# Patient Record
Sex: Male | Born: 1985 | Race: Black or African American | Hispanic: No | Marital: Single | Smoking: Current every day smoker
Health system: Southern US, Community
[De-identification: ages and names within clinical notes are randomized; demographics above are authoritative.]

---

## 2007-01-23 ENCOUNTER — Emergency Department (HOSPITAL_COMMUNITY): Admission: EM | Admit: 2007-01-23 | Discharge: 2007-01-23 | Payer: Self-pay | Admitting: Emergency Medicine

## 2009-01-24 ENCOUNTER — Emergency Department (HOSPITAL_COMMUNITY): Admission: EM | Admit: 2009-01-24 | Discharge: 2009-01-24 | Payer: Self-pay | Admitting: Emergency Medicine

## 2011-06-16 ENCOUNTER — Emergency Department (HOSPITAL_COMMUNITY): Payer: Self-pay

## 2011-06-16 ENCOUNTER — Emergency Department (HOSPITAL_COMMUNITY)
Admission: EM | Admit: 2011-06-16 | Discharge: 2011-06-16 | Disposition: A | Discharge status: Home / Self Care | Payer: Self-pay | Attending: Emergency Medicine | Admitting: Emergency Medicine

## 2011-06-16 DIAGNOSIS — M25569 Pain in unspecified knee: Secondary | ICD-10-CM | POA: Insufficient documentation

## 2011-06-16 DIAGNOSIS — IMO0002 Reserved for concepts with insufficient information to code with codable children: Secondary | ICD-10-CM | POA: Insufficient documentation

## 2011-06-16 DIAGNOSIS — X500XXA Overexertion from strenuous movement or load, initial encounter: Secondary | ICD-10-CM | POA: Insufficient documentation
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
## 2011-06-22 ENCOUNTER — Ambulatory Visit (HOSPITAL_BASED_OUTPATIENT_CLINIC_OR_DEPARTMENT_OTHER)
Admission: RE | Admit: 2011-06-22 | Discharge: 2011-06-22 | Disposition: A | Discharge status: Home / Self Care | Payer: Self-pay | Source: Ambulatory Visit | Attending: Orthopedic Surgery | Admitting: Orthopedic Surgery

## 2011-06-22 DIAGNOSIS — F17200 Nicotine dependence, unspecified, uncomplicated: Secondary | ICD-10-CM | POA: Insufficient documentation

## 2011-06-22 DIAGNOSIS — M23205 Derangement of unspecified medial meniscus due to old tear or injury, unspecified knee: Secondary | ICD-10-CM | POA: Insufficient documentation

## 2011-06-22 DIAGNOSIS — Z01812 Encounter for preprocedural laboratory examination: Secondary | ICD-10-CM | POA: Insufficient documentation

## 2011-06-22 LAB — POCT HEMOGLOBIN-HEMACUE: Hemoglobin: 15.8 g/dL (ref 13.0–17.0)

## 2011-07-06 ENCOUNTER — Emergency Department (HOSPITAL_COMMUNITY)
Admission: EM | Admit: 2011-07-06 | Discharge: 2011-07-06 | Discharge status: Against Medical Advice | Payer: Self-pay | Attending: Emergency Medicine | Admitting: Emergency Medicine

## 2011-07-06 ENCOUNTER — Emergency Department (HOSPITAL_COMMUNITY)
Admission: EM | Admit: 2011-07-06 | Discharge: 2011-07-06 | Disposition: A | Discharge status: Home / Self Care | Payer: Self-pay | Attending: Emergency Medicine | Admitting: Emergency Medicine

## 2011-07-06 DIAGNOSIS — R609 Edema, unspecified: Secondary | ICD-10-CM | POA: Insufficient documentation

## 2011-07-06 DIAGNOSIS — R209 Unspecified disturbances of skin sensation: Secondary | ICD-10-CM | POA: Insufficient documentation

## 2011-07-06 DIAGNOSIS — R21 Rash and other nonspecific skin eruption: Secondary | ICD-10-CM | POA: Insufficient documentation

## 2011-07-06 DIAGNOSIS — M7989 Other specified soft tissue disorders: Secondary | ICD-10-CM | POA: Insufficient documentation

## 2011-07-06 DIAGNOSIS — M25469 Effusion, unspecified knee: Secondary | ICD-10-CM | POA: Insufficient documentation
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
## 2019-02-19 ENCOUNTER — Encounter (HOSPITAL_COMMUNITY): Payer: Self-pay | Admitting: Emergency Medicine

## 2019-02-19 ENCOUNTER — Emergency Department (HOSPITAL_COMMUNITY): Payer: Self-pay

## 2019-02-19 ENCOUNTER — Other Ambulatory Visit: Payer: Self-pay

## 2019-02-19 ENCOUNTER — Emergency Department (HOSPITAL_COMMUNITY)
Admission: EM | Admit: 2019-02-19 | Discharge: 2019-02-19 | Disposition: A | Discharge status: Home / Self Care | Payer: Self-pay | Attending: Emergency Medicine | Admitting: Emergency Medicine

## 2019-02-19 DIAGNOSIS — F17200 Nicotine dependence, unspecified, uncomplicated: Secondary | ICD-10-CM | POA: Insufficient documentation

## 2019-02-19 DIAGNOSIS — S63055A Dislocation of other carpometacarpal joint of left hand, initial encounter: Secondary | ICD-10-CM | POA: Insufficient documentation

## 2019-02-19 DIAGNOSIS — Y929 Unspecified place or not applicable: Secondary | ICD-10-CM | POA: Insufficient documentation

## 2019-02-19 DIAGNOSIS — S62345A Nondisplaced fracture of base of fourth metacarpal bone, left hand, initial encounter for closed fracture: Secondary | ICD-10-CM | POA: Insufficient documentation

## 2019-02-19 DIAGNOSIS — Y9389 Activity, other specified: Secondary | ICD-10-CM | POA: Insufficient documentation

## 2019-02-19 DIAGNOSIS — Y998 Other external cause status: Secondary | ICD-10-CM | POA: Insufficient documentation

## 2019-02-19 DIAGNOSIS — W2209XA Striking against other stationary object, initial encounter: Secondary | ICD-10-CM | POA: Insufficient documentation

## 2019-02-19 DIAGNOSIS — R52 Pain, unspecified: Secondary | ICD-10-CM

## 2019-02-19 DIAGNOSIS — S62365A Nondisplaced fracture of neck of fourth metacarpal bone, left hand, initial encounter for closed fracture: Secondary | ICD-10-CM

## 2019-02-19 DIAGNOSIS — S62347A Nondisplaced fracture of base of fifth metacarpal bone. left hand, initial encounter for closed fracture: Secondary | ICD-10-CM | POA: Insufficient documentation

## 2019-02-19 MED ORDER — LIDOCAINE-EPINEPHRINE 2 %-1:100000 IJ SOLN
20.0000 mL | Freq: Once | INTRAMUSCULAR | Status: AC
  Administered 2019-02-19: 20 mL
  Filled 2019-02-19 (×2): qty 20

## 2019-02-19 MED ORDER — IBUPROFEN 400 MG PO TABS
600.0000 mg | ORAL_TABLET | Freq: Once | ORAL | Status: AC
  Administered 2019-02-19: 600 mg via ORAL
  Filled 2019-02-19: qty 1

## 2019-02-19 NOTE — ED Notes (Signed)
Gave pt water and gatorade to drink. Encouraged pt to drink fluids.

## 2019-02-19 NOTE — ED Notes (Signed)
Pt was alert when ambulated to exit.

## 2019-02-19 NOTE — ED Notes (Signed)
Pt given a ice pack for lt hand

## 2019-02-19 NOTE — Discharge Instructions (Addendum)
Use ice regularly for swelling.  Tylenol every 4 hrs and ibuprofen 600 ng every 6 hrs for pain.  Elevate when you are able to.  Follow-up closely with hand surgeon.  Wear splint until you see the hand surgeon.

## 2019-02-19 NOTE — ED Triage Notes (Signed)
Pt st's he punched a wall approx 1 hr. Ago  Pt has pain and swelling to left hand.  Ice pack applied in triage

## 2019-02-19 NOTE — ED Notes (Signed)
MD made aware of blood pressure at 0415 of 134/76

## 2019-02-19 NOTE — ED Notes (Signed)
Ortho at bedside for splint.

## 2019-02-19 NOTE — ED Notes (Signed)
ED Provider at bedside. 
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
## 2019-02-19 NOTE — ED Notes (Signed)
Ortho called for splint  

## 2019-02-19 NOTE — ED Notes (Signed)
Portable xray at bedside.

## 2019-02-20 ENCOUNTER — Other Ambulatory Visit: Payer: Self-pay | Admitting: Orthopedic Surgery

## 2019-02-21 ENCOUNTER — Ambulatory Visit (HOSPITAL_BASED_OUTPATIENT_CLINIC_OR_DEPARTMENT_OTHER): Payer: Self-pay | Admitting: Anesthesiology

## 2019-02-21 ENCOUNTER — Encounter (HOSPITAL_BASED_OUTPATIENT_CLINIC_OR_DEPARTMENT_OTHER)
Admission: RE | Disposition: A | Discharge status: Home / Self Care | Payer: Self-pay | Source: Home / Self Care | Attending: Orthopedic Surgery

## 2019-02-21 ENCOUNTER — Ambulatory Visit (HOSPITAL_BASED_OUTPATIENT_CLINIC_OR_DEPARTMENT_OTHER)
Admission: RE | Admit: 2019-02-21 | Discharge: 2019-02-21 | Disposition: A | Discharge status: Home / Self Care | Payer: Self-pay | Attending: Orthopedic Surgery | Admitting: Orthopedic Surgery

## 2019-02-21 ENCOUNTER — Encounter (HOSPITAL_BASED_OUTPATIENT_CLINIC_OR_DEPARTMENT_OTHER): Payer: Self-pay | Admitting: Anesthesiology

## 2019-02-21 ENCOUNTER — Other Ambulatory Visit: Payer: Self-pay

## 2019-02-21 DIAGNOSIS — Y939 Activity, unspecified: Secondary | ICD-10-CM | POA: Insufficient documentation

## 2019-02-21 DIAGNOSIS — F17200 Nicotine dependence, unspecified, uncomplicated: Secondary | ICD-10-CM | POA: Insufficient documentation

## 2019-02-21 DIAGNOSIS — X58XXXA Exposure to other specified factors, initial encounter: Secondary | ICD-10-CM | POA: Insufficient documentation

## 2019-02-21 DIAGNOSIS — S63055A Dislocation of other carpometacarpal joint of left hand, initial encounter: Secondary | ICD-10-CM | POA: Insufficient documentation

## 2019-02-21 HISTORY — PX: PERCUTANEOUS PINNING: SHX2209

## 2019-02-21 SURGERY — PINNING, EXTREMITY, PERCUTANEOUS
Anesthesia: General | Site: Hand | Laterality: Left

## 2019-02-21 MED ORDER — OXYCODONE-ACETAMINOPHEN 5-325 MG PO TABS: 1.0000 | ORAL_TABLET | ORAL | 0 refills | Status: AC | PRN

## 2019-02-21 MED ORDER — FENTANYL CITRATE (PF) 100 MCG/2ML IJ SOLN
INTRAMUSCULAR | Status: AC
  Filled 2019-02-21: qty 2

## 2019-02-21 MED ORDER — CEFAZOLIN SODIUM-DEXTROSE 2-4 GM/100ML-% IV SOLN
INTRAVENOUS | Status: AC
  Filled 2019-02-21: qty 100

## 2019-02-21 MED ORDER — CHLORHEXIDINE GLUCONATE 4 % EX LIQD: 60.0000 mL | Freq: Once | CUTANEOUS | Status: DC

## 2019-02-21 MED ORDER — ACETAMINOPHEN 325 MG PO TABS: 325.0000 mg | ORAL_TABLET | ORAL | Status: DC | PRN

## 2019-02-21 MED ORDER — OXYCODONE HCL 5 MG/5ML PO SOLN: 5.0000 mg | Freq: Once | ORAL | Status: DC | PRN

## 2019-02-21 MED ORDER — LACTATED RINGERS IV SOLN
INTRAVENOUS | Status: DC
  Administered 2019-02-21: 13:00:00 via INTRAVENOUS

## 2019-02-21 MED ORDER — MIDAZOLAM HCL 2 MG/2ML IJ SOLN
INTRAMUSCULAR | Status: AC
  Filled 2019-02-21: qty 2

## 2019-02-21 MED ORDER — MEPERIDINE HCL 25 MG/ML IJ SOLN: 6.2500 mg | INTRAMUSCULAR | Status: DC | PRN

## 2019-02-21 MED ORDER — FENTANYL CITRATE (PF) 100 MCG/2ML IJ SOLN
50.0000 ug | INTRAMUSCULAR | Status: DC | PRN
  Administered 2019-02-21: 100 ug via INTRAVENOUS
  Administered 2019-02-21: 14:00:00 50 ug via INTRAVENOUS

## 2019-02-21 MED ORDER — ACETAMINOPHEN 160 MG/5ML PO SOLN: 325.0000 mg | ORAL | Status: DC | PRN

## 2019-02-21 MED ORDER — LIDOCAINE 2% (20 MG/ML) 5 ML SYRINGE
INTRAMUSCULAR | Status: DC | PRN
  Administered 2019-02-21: 80 mg via INTRAVENOUS

## 2019-02-21 MED ORDER — CEFAZOLIN SODIUM-DEXTROSE 2-4 GM/100ML-% IV SOLN
2.0000 g | INTRAVENOUS | Status: AC
  Administered 2019-02-21: 14:00:00 2 g via INTRAVENOUS

## 2019-02-21 MED ORDER — BUPIVACAINE-EPINEPHRINE (PF) 0.5% -1:200000 IJ SOLN
INTRAMUSCULAR | Status: DC | PRN
  Administered 2019-02-21: 20 mL via PERINEURAL

## 2019-02-21 MED ORDER — DEXAMETHASONE SODIUM PHOSPHATE 10 MG/ML IJ SOLN
INTRAMUSCULAR | Status: DC | PRN
  Administered 2019-02-21: 10 mg via INTRAVENOUS

## 2019-02-21 MED ORDER — BUPIVACAINE LIPOSOME 1.3 % IJ SUSP
INTRAMUSCULAR | Status: DC | PRN
  Administered 2019-02-21: 10 mL via PERINEURAL

## 2019-02-21 MED ORDER — OXYCODONE HCL 5 MG PO TABS: 5.0000 mg | ORAL_TABLET | Freq: Once | ORAL | Status: DC | PRN

## 2019-02-21 MED ORDER — ONDANSETRON HCL 4 MG/2ML IJ SOLN
INTRAMUSCULAR | Status: DC | PRN
  Administered 2019-02-21: 4 mg via INTRAVENOUS

## 2019-02-21 MED ORDER — PROPOFOL 10 MG/ML IV BOLUS
INTRAVENOUS | Status: DC | PRN
  Administered 2019-02-21: 150 mg via INTRAVENOUS

## 2019-02-21 MED ORDER — ONDANSETRON HCL 4 MG/2ML IJ SOLN: 4.0000 mg | Freq: Once | INTRAMUSCULAR | Status: DC | PRN

## 2019-02-21 MED ORDER — FENTANYL CITRATE (PF) 100 MCG/2ML IJ SOLN: 25.0000 ug | INTRAMUSCULAR | Status: DC | PRN

## 2019-02-21 MED ORDER — MIDAZOLAM HCL 2 MG/2ML IJ SOLN
1.0000 mg | INTRAMUSCULAR | Status: DC | PRN
  Administered 2019-02-21 (×2): 2 mg via INTRAVENOUS

## 2019-02-21 MED ORDER — SCOPOLAMINE 1 MG/3DAYS TD PT72: 1.0000 | MEDICATED_PATCH | Freq: Once | TRANSDERMAL | Status: DC | PRN

## 2019-02-21 SURGICAL SUPPLY — 62 items
APL SKNCLS STERI-STRIP NONHPOA (GAUZE/BANDAGES/DRESSINGS)
BAG DECANTER FOR FLEXI CONT (MISCELLANEOUS) IMPLANT
BANDAGE ACE 3X5.8 VEL STRL LF (GAUZE/BANDAGES/DRESSINGS) ×3 IMPLANT
BANDAGE ACE 4X5 VEL STRL LF (GAUZE/BANDAGES/DRESSINGS) ×3 IMPLANT
BANDAGE COBAN LF 1.5X5 NS (GAUZE/BANDAGES/DRESSINGS) IMPLANT
BENZOIN TINCTURE PRP APPL 2/3 (GAUZE/BANDAGES/DRESSINGS) IMPLANT
BLADE SURG 15 STRL LF DISP TIS (BLADE) ×1 IMPLANT
BLADE SURG 15 STRL SS (BLADE) ×3
BNDG CMPR 9X4 STRL LF SNTH (GAUZE/BANDAGES/DRESSINGS) ×1
BNDG ESMARK 4X9 LF (GAUZE/BANDAGES/DRESSINGS) ×3 IMPLANT
BNDG GAUZE ELAST 4 BULKY (GAUZE/BANDAGES/DRESSINGS) ×3 IMPLANT
CLOSURE WOUND 1/2 X4 (GAUZE/BANDAGES/DRESSINGS)
CORD BIPOLAR FORCEPS 12FT (ELECTRODE) IMPLANT
COVER BACK TABLE REUSABLE LG (DRAPES) ×3 IMPLANT
COVER WAND RF STERILE (DRAPES) IMPLANT
CUFF TOURN SGL QUICK 18X4 (TOURNIQUET CUFF) ×3 IMPLANT
DECANTER SPIKE VIAL GLASS SM (MISCELLANEOUS) IMPLANT
DRAPE EXTREMITY T 121X128X90 (DISPOSABLE) ×3 IMPLANT
DRAPE OEC MINIVIEW 54X84 (DRAPES) ×3 IMPLANT
DRAPE SURG 17X23 STRL (DRAPES) ×3 IMPLANT
DURAPREP 26ML APPLICATOR (WOUND CARE) IMPLANT
GAUZE SPONGE 4X4 12PLY STRL (GAUZE/BANDAGES/DRESSINGS) ×3 IMPLANT
GAUZE XEROFORM 1X8 LF (GAUZE/BANDAGES/DRESSINGS) ×3 IMPLANT
GLOVE SURG SYN 8.0 (GLOVE) ×6 IMPLANT
GOWN STRL REIN XL XLG (GOWN DISPOSABLE) ×3 IMPLANT
GOWN STRL REUS W/ TWL LRG LVL3 (GOWN DISPOSABLE) ×1 IMPLANT
GOWN STRL REUS W/TWL LRG LVL3 (GOWN DISPOSABLE) ×3
K-WIRE .062X4 (WIRE) ×6 IMPLANT
LOOP VESSEL MAXI BLUE (MISCELLANEOUS) IMPLANT
NEEDLE HYPO 25X1 1.5 SAFETY (NEEDLE) IMPLANT
NEEDLE PRECISIONGLIDE 27X1.5 (NEEDLE) IMPLANT
NS IRRIG 1000ML POUR BTL (IV SOLUTION) IMPLANT
PACK BASIN DAY SURGERY FS (CUSTOM PROCEDURE TRAY) ×3 IMPLANT
PAD CAST 3X4 CTTN HI CHSV (CAST SUPPLIES) ×1 IMPLANT
PADDING CAST ABS 3INX4YD NS (CAST SUPPLIES) ×2
PADDING CAST ABS 4INX4YD NS (CAST SUPPLIES) ×2
PADDING CAST ABS COTTON 3X4 (CAST SUPPLIES) ×1 IMPLANT
PADDING CAST ABS COTTON 4X4 ST (CAST SUPPLIES) ×1 IMPLANT
PADDING CAST COTTON 3X4 STRL (CAST SUPPLIES) ×3
SHEET MEDIUM DRAPE 40X70 STRL (DRAPES) ×3 IMPLANT
SLING ARM FOAM STRAP XLG (SOFTGOODS) ×3 IMPLANT
SPLINT PLASTER CAST XFAST 3X15 (CAST SUPPLIES) IMPLANT
SPLINT PLASTER CAST XFAST 4X15 (CAST SUPPLIES) ×15 IMPLANT
SPLINT PLASTER XTRA FAST SET 4 (CAST SUPPLIES) ×30
SPLINT PLASTER XTRA FASTSET 3X (CAST SUPPLIES)
STOCKINETTE 4X48 STRL (DRAPES) ×3 IMPLANT
STRIP CLOSURE SKIN 1/2X4 (GAUZE/BANDAGES/DRESSINGS) IMPLANT
SUT ETHILON 3 0 PS 1 (SUTURE) IMPLANT
SUT ETHILON 5 0 PS 2 18 (SUTURE) IMPLANT
SUT PROLENE 3 0 PS 2 (SUTURE) IMPLANT
SUT VIC AB 4-0 P-3 18XBRD (SUTURE) IMPLANT
SUT VIC AB 4-0 P3 18 (SUTURE)
SUT VICRYL RAPIDE 4-0 (SUTURE) IMPLANT
SUT VICRYL RAPIDE 4/0 PS 2 (SUTURE) IMPLANT
SWAB COLLECTION DEVICE MRSA (MISCELLANEOUS) IMPLANT
SWAB CULTURE ESWAB REG 1ML (MISCELLANEOUS) IMPLANT
SYR 10ML LL (SYRINGE) IMPLANT
SYR BULB 3OZ (MISCELLANEOUS) IMPLANT
SYR CONTROL 10ML LL (SYRINGE) IMPLANT
TOWEL GREEN STERILE FF (TOWEL DISPOSABLE) ×3 IMPLANT
TRAY DSU PREP LF (CUSTOM PROCEDURE TRAY) ×3 IMPLANT
UNDERPAD 30X30 (UNDERPADS AND DIAPERS) ×3 IMPLANT

## 2019-02-21 NOTE — Transfer of Care (Signed)
Immediate Anesthesia Transfer of Care Note  Patient: [name]  Procedure(s) Performed: CLOSED REDUCTION AND PERCUTANEOUS PINNING LEFT SMALL AND RING  FINGER (Left Hand)  Patient Location: PACU  Anesthesia Type:General  Level of Consciousness: sedated and patient cooperative  Airway & Oxygen Therapy: Patient Spontanous Breathing and Patient connected to nasal cannula oxygen  Post-op Assessment: Report given to RN and Post -op Vital signs reviewed and stable  Post vital signs: Reviewed and stable  Last Vitals:  Vitals Value Taken Time  BP    Temp    Pulse 64 02/21/2019  2:19 PM  Resp    SpO2 98 % 02/21/2019  2:19 PM  Vitals shown include unvalidated device data.  Last Pain:  Vitals:   02/21/19 1312  TempSrc: Oral  PainSc: 8          Complications: No apparent anesthesia complications

## 2019-02-21 NOTE — Progress Notes (Signed)
Assisted Dr. Oddono with left, ultrasound guided, axillary block. Side rails up, monitors on throughout procedure. See vital signs in flow sheet. Tolerated Procedure well. 

## 2019-02-21 NOTE — Anesthesia Postprocedure Evaluation (Signed)
Anesthesia Post Note  Patient: [name]  Procedure(s) Performed: CLOSED REDUCTION AND PERCUTANEOUS PINNING LEFT SMALL AND RING  FINGER (Left Hand)     Anesthesia Type: General    Last Vitals:  Vitals:   02/21/19 1334 02/21/19 1419  BP:  102/60  Pulse: 60 64  Resp: 11 (!) 9  Temp:  36.5 C  SpO2: 100% 98%    Last Pain:  Vitals:   02/21/19 1419  TempSrc:   PainSc: Asleep                 Karoline Fleer

## 2019-02-21 NOTE — Discharge Instructions (Signed)
Post Anesthesia Home Care Instructions  Activity: Get plenty of rest for the remainder of the day. A responsible individual must stay with you for 24 hours following the procedure.  For the next 24 hours, DO NOT: -Drive a car -Operate machinery -Drink alcoholic beverages -Take any medication unless instructed by your physician -Make any legal decisions or sign important papers.  Meals: Start with liquid foods such as gelatin or soup. Progress to regular foods as tolerated. Avoid greasy, spicy, heavy foods. If nausea and/or vomiting occur, drink only clear liquids until the nausea and/or vomiting subsides. Call your physician if vomiting continues.  Special Instructions/Symptoms: Your throat may feel dry or sore from the anesthesia or the breathing tube placed in your throat during surgery. If this causes discomfort, gargle with warm salt water. The discomfort should disappear within 24 hours.      Regional Anesthesia Blocks  1. Numbness or the inability to move the "blocked" extremity may last from 3-48 hours after placement. The length of time depends on the medication injected and your individual response to the medication. If the numbness is not going away after 48 hours, call your surgeon.  2. The extremity that is blocked will need to be protected until the numbness is gone and the  Strength has returned. Because you cannot feel it, you will need to take extra care to avoid injury. Because it may be weak, you may have difficulty moving it or using it. You may not know what position it is in without looking at it while the block is in effect.  3. For blocks in the legs and feet, returning to weight bearing and walking needs to be done carefully. You will need to wait until the numbness is entirely gone and the strength has returned. You should be able to move your leg and foot normally before you try and bear weight or walk. You will need someone to be with you when you first try to  ensure you do not fall and possibly risk injury.  4. Bruising and tenderness at the needle site are common side effects and will resolve in a few days.  5. Persistent numbness or new problems with movement should be communicated to the surgeon or the [facility] ([phone])/ [facility] ([phone]).Information for Discharge Teaching: EXPAREL (bupivacaine liposome injectable suspension)   Your surgeon or anesthesiologist gave you EXPAREL(bupivacaine) to help control your pain after surgery.   EXPAREL is a local anesthetic that provides pain relief by numbing the tissue around the surgical site.  EXPAREL is designed to release pain medication over time and can control pain for up to 72 hours.  Depending on how you respond to EXPAREL, you may require less pain medication during your recovery.  Possible side effects:  Temporary loss of sensation or ability to move in the area where bupivacaine was injected.  Nausea, vomiting, constipation  Rarely, numbness and tingling in your mouth or lips, lightheadedness, or anxiety may occur.  Call your doctor right away if you think you may be experiencing any of these sensations, or if you have other questions regarding possible side effects.  Follow all other discharge instructions given to you by your surgeon or nurse. Eat a healthy diet and drink plenty of water or other fluids.  If you return to the hospital for any reason within 96 hours following the administration of EXPAREL, it is important for health care providers to know that you have received this anesthetic. A   teal colored band has been placed on your arm with the date, time and amount of EXPAREL you have received in order to alert and inform your health care providers. Please leave this armband in place for the full 96 hours following administration, and then you may remove the band. 

## 2019-02-21 NOTE — Op Note (Signed)
Please see dictated report [phone]

## 2019-02-21 NOTE — Anesthesia Procedure Notes (Signed)
Procedure Name: LMA Insertion Date/Time: 02/21/2019 1:46 PM Performed by: Tyrone Nine, CRNA Pre-anesthesia Checklist: Patient identified, Emergency Drugs available, Suction available and Patient being monitored Patient Re-evaluated:Patient Re-evaluated prior to induction Oxygen Delivery Method: Circle system utilized Preoxygenation: Pre-oxygenation with 100% oxygen Induction Type: IV induction Ventilation: Mask ventilation without difficulty LMA: LMA inserted LMA Size: 4.0 Number of attempts: 1 Placement Confirmation: breath sounds checked- equal and bilateral,  CO2 detector and positive ETCO2 Tube secured with: Tape Dental Injury: Teeth and Oropharynx as per pre-operative assessment

## 2019-02-21 NOTE — Anesthesia Procedure Notes (Signed)
Anesthesia Regional Block: Axillary brachial plexus block   Pre-Anesthetic Checklist: ,, timeout performed, Correct Patient, Correct Site, Correct Laterality, Correct Procedure, Correct Position, site marked, Risks and benefits discussed,  Surgical consent,  Pre-op evaluation,  At surgeon's request and post-op pain management  Laterality: Left  Prep: chloraprep       Needles:  Injection technique: Single-shot  Needle Type: Echogenic Stimulator Needle     Needle Length: 5cm  Needle Gauge: 22     Additional Needles:   Procedures:, nerve stimulator,,, ultrasound used (permanent image in chart),,,,   Nerve Stimulator or Paresthesia:  Response: hand, 0.45 mA,   Additional Responses:   Narrative:  Start time: 02/21/2019 1:30 PM End time: 02/21/2019 1:35 PM Injection made incrementally with aspirations every 5 mL.  Performed by: Personally  Anesthesiologist: Bethena Midget, MD  Additional Notes: Functioning IV was confirmed and monitors were applied.  A 60mm 22ga Arrow echogenic stimulator needle was used. Sterile prep and drape,hand hygiene and sterile gloves were used. Ultrasound guidance: relevant anatomy identified, needle position confirmed, local anesthetic spread visualized around nerve(s)., vascular puncture avoided.  Image printed for medical record. Negative aspiration and negative test dose prior to incremental administration of local anesthetic. The patient tolerated the procedure well.
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
## 2019-02-21 NOTE — Op Note (Signed)
[name], [name] MEDICAL RECORD ID:[id number] ACCOUNT [id number] DATE OF BIRTH:02-08-1986 FACILITY: [facility] LOCATION: [facility] PHYSICIAN:Sylvester Minton A. Mina Marble, MD  OPERATIVE REPORT  DATE OF PROCEDURE:  02/21/2019  PREOPERATIVE DIAGNOSIS:  Left ring and left small carpometacarpal fracture dislocations.  POSTOPERATIVE DIAGNOSIS:  Left ring and left small carpometacarpal fracture dislocations.  PROCEDURE:  Closed reduction and percutaneous pinning of left ring and left small carpometacarpal fracture dislocation.  SURGEON:  Dairl Ponder, MD  ASSISTANT:  None.  ANESTHESIA:  Regional and general.  COMPLICATIONS:  No complications.  DRAINS:  No drains.  DESCRIPTION OF PROCEDURE:  The patient was taken to the operating suite after induction of regional anesthetic and LMA anesthesia.  Left upper extremity was prepped and draped in the usual sterile fashion.  An Esmarch was used to exsanguinate the limb.   Tourniquet was inflated to 250 mmHg.  At this point in time, a longitudinal traction downward pressure of the small metacarpal base was used to reduce the [facility] joint.  Under fluoroscopic imaging, a 0.062 K-wire was driven from the shaft of the small  metacarpal into the hamate, which was identified and confirmed on fluoroscopy.  This 0.045 K-wire was then cut at the skin.  A second 00 0.045 K-wire was driven from the shaft into the ring and then to the long metacarpal stabilizing the ring CMC joint.   This was also confirmed fluoroscopically in multiple images.  A second K-wire was cut outside the skin and bent upon itself.  We then dressed with Xeroform, 4 x 4's, fluffs and an ulnar gutter splint.    The patient tolerated this procedure well and went to recovery in stable fashion.  AN/NUANCE  D:02/21/2019 T:02/21/2019 JOB:[id number]

## 2019-02-21 NOTE — Anesthesia Preprocedure Evaluation (Addendum)
Anesthesia Evaluation  Patient identified by MRN, date of birth, ID band Patient awake    Reviewed: Allergy & Precautions, H&P , NPO status , Patient's Chart, lab work & pertinent test results, reviewed documented beta blocker date and time   Airway Mallampati: II  TM Distance: >3 FB Neck ROM: full    Dental no notable dental hx. (+) Teeth Intact, Dental Advisory Given   Pulmonary neg pulmonary ROS, Current Smoker,    Pulmonary exam normal breath sounds clear to auscultation       Cardiovascular Exercise Tolerance: Good negative cardio ROS   Rhythm:regular Rate:Normal     Neuro/Psych negative neurological ROS  negative psych ROS   GI/Hepatic negative GI ROS, Neg liver ROS,   Endo/Other  negative endocrine ROS  Renal/GU negative Renal ROS  negative genitourinary   Musculoskeletal   Abdominal   Peds  Hematology negative hematology ROS (+)   Anesthesia Other Findings   Reproductive/Obstetrics negative OB ROS                            Anesthesia Physical Anesthesia Plan  ASA: II  Anesthesia Plan: General   Post-op Pain Management: GA combined w/ Regional for post-op pain   Induction: Intravenous  PONV Risk Score and Plan: 2 and Ondansetron, Treatment may vary due to age or medical condition, Dexamethasone and Midazolam  Airway Management Planned: Oral ETT and LMA  Additional Equipment:   Intra-op Plan:   Post-operative Plan: Extubation in OR  Informed Consent: I have reviewed the patients History and Physical, chart, labs and discussed the procedure including the risks, benefits and alternatives for the proposed anesthesia with the patient or authorized representative who has indicated his/her understanding and acceptance.     Dental Advisory Given and Dental advisory given  Plan Discussed with: CRNA, Anesthesiologist and Surgeon  Anesthesia Plan Comments: (Discussed both  nerve block for pain relief post-op and GA; including NV, sore throat, dental injury, and pulmonary complications)       Anesthesia Quick Evaluation

## 2019-02-22 ENCOUNTER — Encounter (HOSPITAL_BASED_OUTPATIENT_CLINIC_OR_DEPARTMENT_OTHER): Payer: Self-pay | Admitting: Orthopedic Surgery

## 2019-03-22 ENCOUNTER — Emergency Department (HOSPITAL_COMMUNITY)
Admission: EM | Admit: 2019-03-22 | Discharge: 2019-03-23 | Disposition: A | Discharge status: Home / Self Care | Payer: Self-pay | Attending: Emergency Medicine | Admitting: Emergency Medicine

## 2019-03-22 ENCOUNTER — Emergency Department (HOSPITAL_COMMUNITY): Payer: Self-pay

## 2019-03-22 ENCOUNTER — Other Ambulatory Visit: Payer: Self-pay

## 2019-03-22 DIAGNOSIS — F1290 Cannabis use, unspecified, uncomplicated: Secondary | ICD-10-CM | POA: Insufficient documentation

## 2019-03-22 DIAGNOSIS — F17210 Nicotine dependence, cigarettes, uncomplicated: Secondary | ICD-10-CM | POA: Insufficient documentation

## 2019-03-22 DIAGNOSIS — Y939 Activity, unspecified: Secondary | ICD-10-CM | POA: Insufficient documentation

## 2019-03-22 DIAGNOSIS — S60212A Contusion of left wrist, initial encounter: Secondary | ICD-10-CM | POA: Insufficient documentation

## 2019-03-22 DIAGNOSIS — Y999 Unspecified external cause status: Secondary | ICD-10-CM | POA: Insufficient documentation

## 2019-03-22 DIAGNOSIS — Y929 Unspecified place or not applicable: Secondary | ICD-10-CM | POA: Insufficient documentation
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
## 2019-03-22 NOTE — ED Notes (Signed)
Pt to xray
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
## 2020-02-18 IMAGING — CR LEFT WRIST - COMPLETE 3+ VIEW
4 series · 4 of 4 positions shown · non-contrast
Comparison: Prior radiograph from 02/19/2019.

CLINICAL DATA: Initial evaluation for acute injury, recent surgery.

EXAM:
LEFT WRIST - COMPLETE 3+ VIEW

[wrist pa]
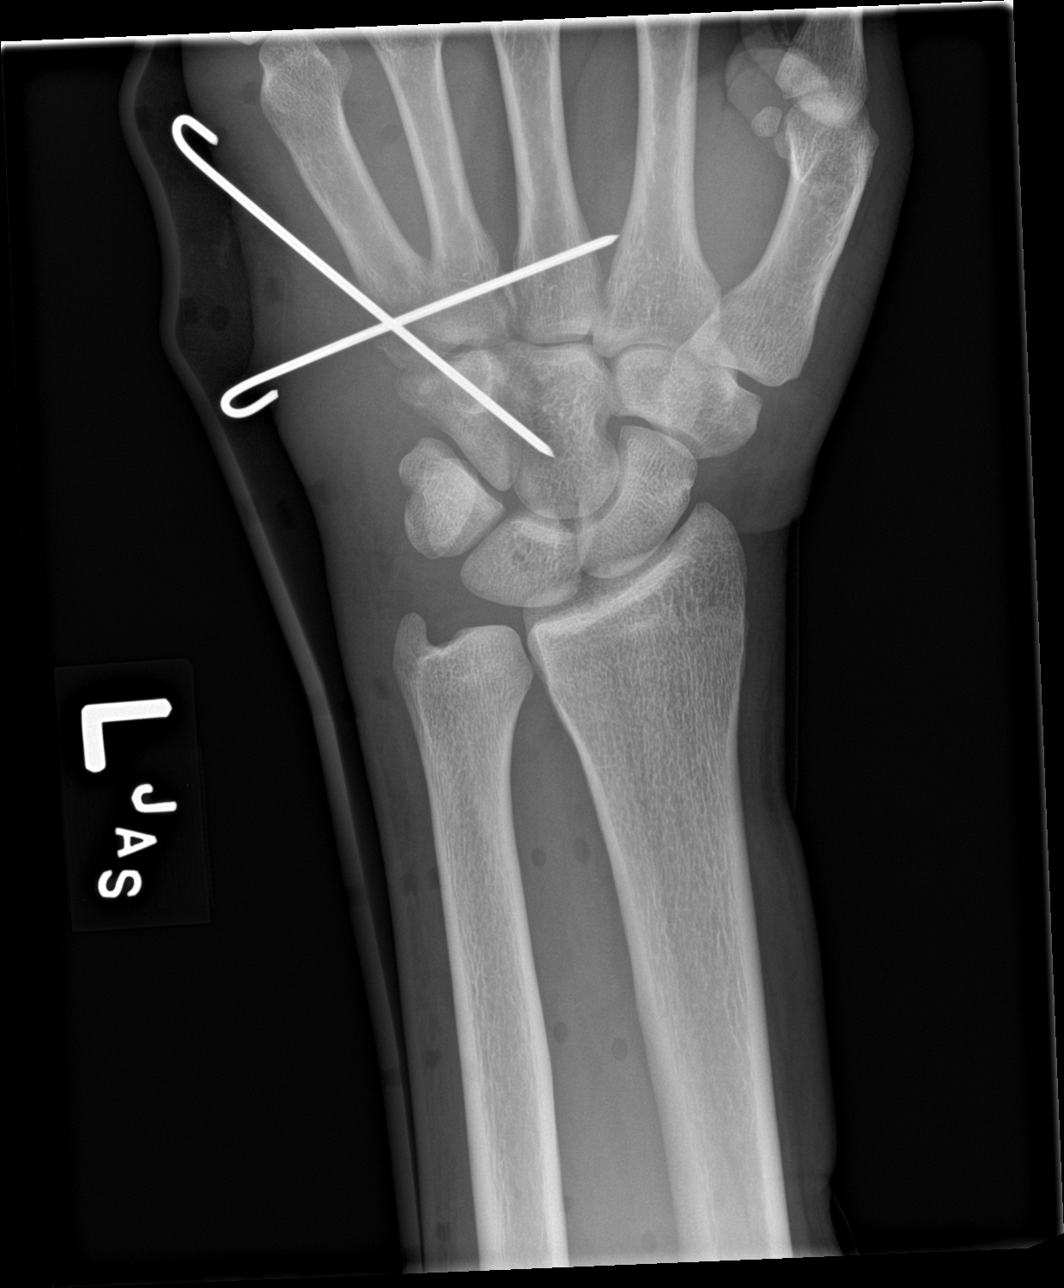

[wrist obl]
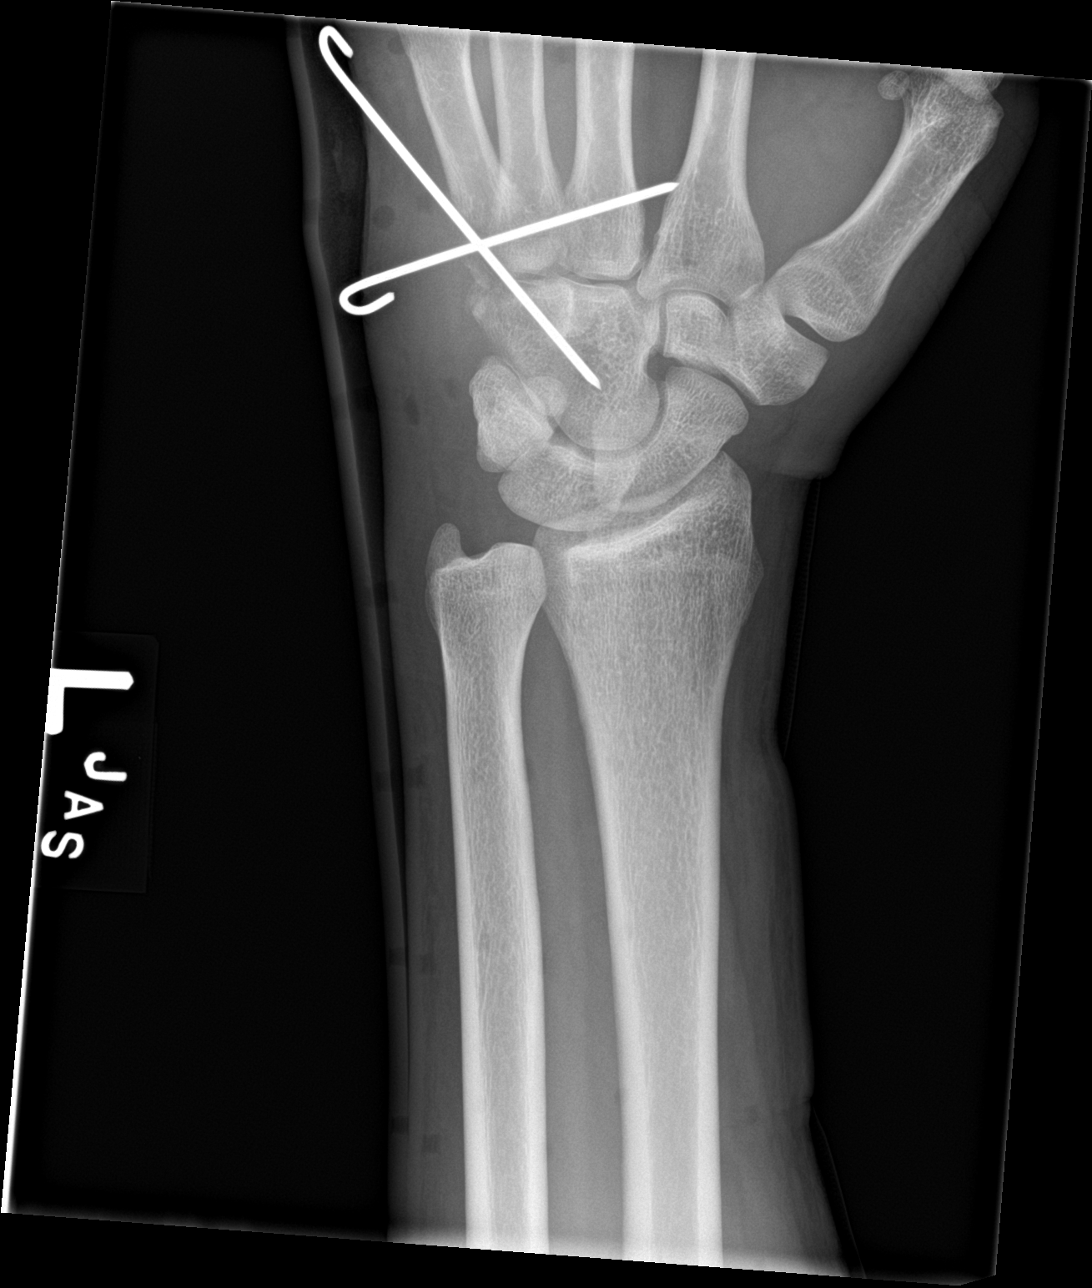

[wrist lat]
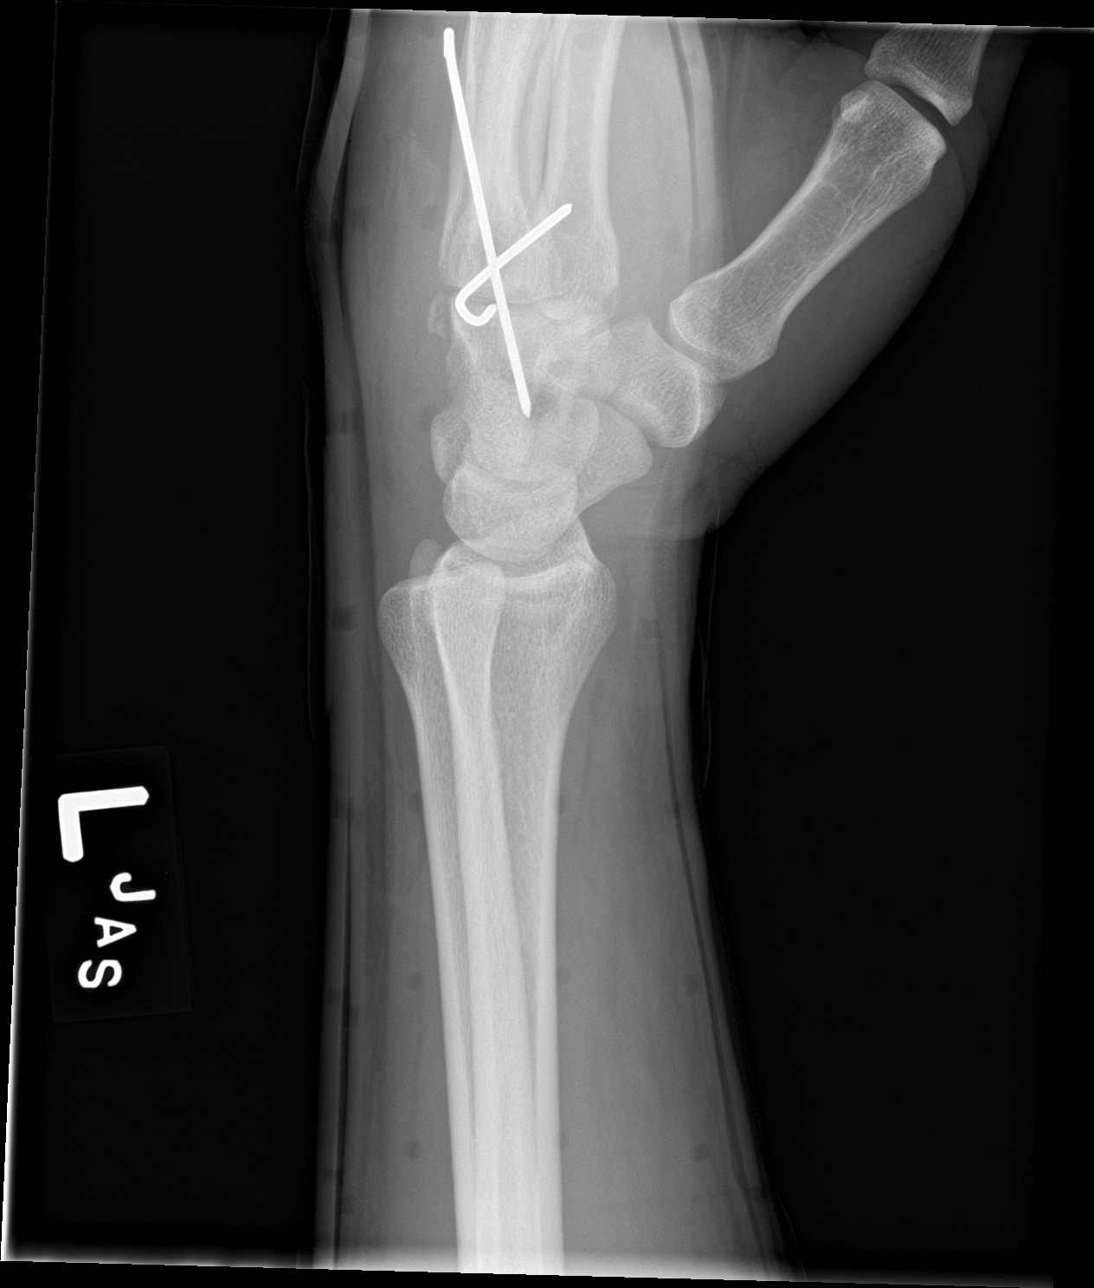

[wrist navicular]
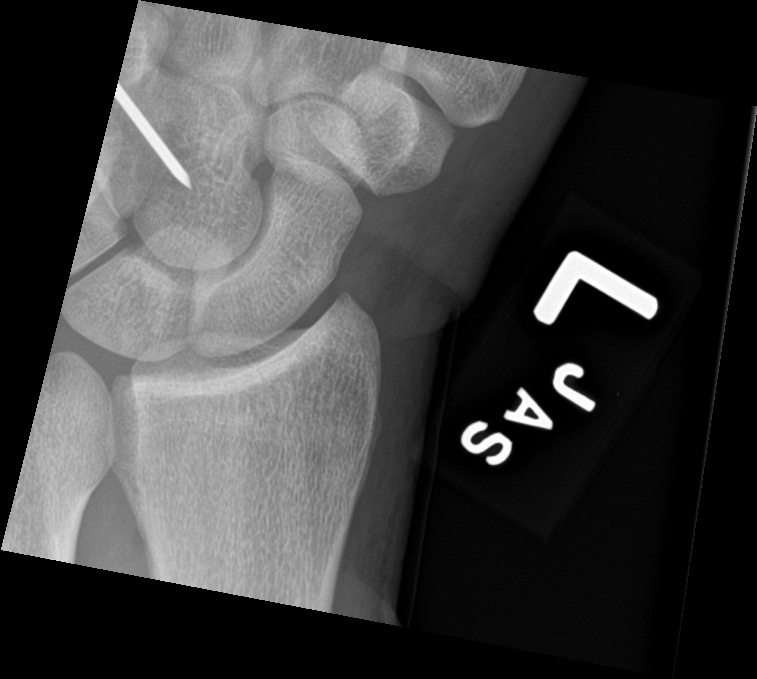

[4 of 4 positions shown; findings below may reference images not displayed]

FINDINGS: Splinting material overlies the left hand and wrist with
percutaneous fixation pins traversing previously identified
fractures involving the base of the fourth and fifth metacarpals.
Fracture fragments in near anatomic alignment, similar to previous.
Overlying soft tissue swelling at the ulnar aspect of the hand and
wrist, likely postoperative. No hardware complication. No other
acute fracture or dislocation. No other acute abnormality about the
wrist.
IMPRESSION: 1. Sequelae of recent percutaneous fixation of previously identified
fractures involving the base of the fourth and fifth metacarpals,
similar to previous. No hardware complication.
2. No other acute osseous abnormality about the left wrist.

## 2020-04-20 ENCOUNTER — Emergency Department (HOSPITAL_COMMUNITY): Payer: Self-pay

## 2020-04-20 ENCOUNTER — Encounter (HOSPITAL_COMMUNITY): Payer: Self-pay | Admitting: Emergency Medicine

## 2020-04-20 ENCOUNTER — Emergency Department (HOSPITAL_COMMUNITY)
Admission: EM | Admit: 2020-04-20 | Discharge: 2020-04-20 | Disposition: A | Discharge status: Home / Self Care | Payer: Self-pay | Attending: Emergency Medicine | Admitting: Emergency Medicine

## 2020-04-20 ENCOUNTER — Other Ambulatory Visit: Payer: Self-pay

## 2020-04-20 DIAGNOSIS — W01198A Fall on same level from slipping, tripping and stumbling with subsequent striking against other object, initial encounter: Secondary | ICD-10-CM | POA: Insufficient documentation

## 2020-04-20 DIAGNOSIS — Y929 Unspecified place or not applicable: Secondary | ICD-10-CM | POA: Insufficient documentation

## 2020-04-20 DIAGNOSIS — S20212A Contusion of left front wall of thorax, initial encounter: Secondary | ICD-10-CM | POA: Insufficient documentation

## 2020-04-20 DIAGNOSIS — F17210 Nicotine dependence, cigarettes, uncomplicated: Secondary | ICD-10-CM | POA: Insufficient documentation

## 2020-04-20 DIAGNOSIS — Y939 Activity, unspecified: Secondary | ICD-10-CM | POA: Insufficient documentation

## 2020-04-20 DIAGNOSIS — Y999 Unspecified external cause status: Secondary | ICD-10-CM | POA: Insufficient documentation

## 2020-04-20 MED ORDER — METHOCARBAMOL 500 MG PO TABS: 500.0000 mg | ORAL_TABLET | Freq: Two times a day (BID) | ORAL | 0 refills | Status: AC
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
## 2020-04-20 NOTE — ED Notes (Signed)
Patient verbalizes understanding of discharge instructions . Opportunity for questions and answers were provided . Armband removed by staff ,Pt discharged from ED. W/C  offered at D/C  and Declined W/C at D/C and was escorted to lobby by RN.  

## 2020-04-20 NOTE — Discharge Instructions (Addendum)
Please use Tylenol, ibuprofen and-if these do not control your pain-Robaxin which is a muscle relaxer for pain. Please take plenty of deep breaths.  Please and plenty water.  Please use Tylenol or ibuprofen for pain.  You may use 600 mg ibuprofen every 6 hours or 1000 mg of Tylenol every 6 hours.  You may choose to alternate between the 2.  This would be most effective.  Not to exceed 4 g of Tylenol within 24 hours.  Not to exceed 3200 mg ibuprofen 24 hours.
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
## 2021-08-19 ENCOUNTER — Emergency Department (HOSPITAL_COMMUNITY)
Admission: EM | Admit: 2021-08-19 | Discharge: 2021-08-19 | Disposition: A | Discharge status: Home / Self Care | Payer: Self-pay | Attending: Emergency Medicine | Admitting: Emergency Medicine

## 2021-08-19 ENCOUNTER — Encounter (HOSPITAL_COMMUNITY): Payer: Self-pay | Admitting: Emergency Medicine

## 2021-08-19 DIAGNOSIS — F17200 Nicotine dependence, unspecified, uncomplicated: Secondary | ICD-10-CM | POA: Insufficient documentation

## 2021-08-19 DIAGNOSIS — W260XXA Contact with knife, initial encounter: Secondary | ICD-10-CM | POA: Insufficient documentation

## 2021-08-19 DIAGNOSIS — Y93G3 Activity, cooking and baking: Secondary | ICD-10-CM | POA: Insufficient documentation

## 2021-08-19 DIAGNOSIS — S51811A Laceration without foreign body of right forearm, initial encounter: Secondary | ICD-10-CM | POA: Insufficient documentation

## 2021-08-19 MED ORDER — LIDOCAINE HCL (PF) 1 % IJ SOLN
INTRAMUSCULAR | Status: AC
  Filled 2021-08-19: qty 30

## 2021-08-19 MED ORDER — LIDOCAINE-EPINEPHRINE 1 %-1:100000 IJ SOLN: 10.0000 mL | Freq: Once | INTRAMUSCULAR | Status: DC
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
## 2021-08-19 NOTE — ED Provider Notes (Signed)
[facility] EMERGENCY DEPARTMENT Provider Note   CSN: [id number] Arrival date & time: 08/19/21  5784     History Chief Complaint  Patient presents with   Extremity Laceration    Anees Mccook is a 35 y.o. male.  35y/o male presenting with laceration to the right dorsal forearm.  Injury occurred early morning Nov 1st.  He was cut with a knife a halloween trick gone wrong.  No numbness or tingling.  He came yesterday but left due to wait.  Tetanus UTD.  Another small wound next to the gaping one.  No other injury or complaints at this time.  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  The history is provided by the patient.      History reviewed. No pertinent past medical history.  There are no problems to display for this patient.   Past Surgical History:  Procedure Laterality Date   PERCUTANEOUS PINNING Left 02/21/2019   Procedure: CLOSED REDUCTION AND PERCUTANEOUS PINNING LEFT SMALL AND RING  FINGER;  Surgeon: Dairl Ponder, MD;  Location: [facility];  Service: Orthopedics;  Laterality: Left;  AXILLARY BLOCK       No family history on file.  Social History   Tobacco Use   Smoking status: Every Day   Smokeless tobacco: Never  Vaping Use   Vaping Use: Never used  Substance Use Topics   Alcohol use: Yes   Drug use: Yes    Types: Marijuana    Comment: last time was last night    Home Medications Prior to Admission medications   Medication Sig Start Date End Date Taking? Authorizing Provider  ibuprofen (ADVIL) 600 MG tablet Take 600 mg by mouth every 6 (six) hours as needed.    [provider]  methocarbamol (ROBAXIN) 500 MG tablet Take 1 tablet (500 mg total) by mouth 2 (two) times daily. 04/20/20   Gailen Shelter, PA    Allergies    Patient has no known allergies.  Review of Systems   Review of Systems  All other systems reviewed and are negative.  Physical Exam Updated Vital Signs BP 122/76 (BP  Location: Right Arm)   Pulse 70   Temp 98 F (36.7 C) (Oral)   Resp 16   SpO2 100%   Physical Exam Vitals and nursing note reviewed.  Constitutional:      General: He is not in acute distress.    Appearance: Normal appearance. He is normal weight.  HENT:     Head: Normocephalic.  Cardiovascular:     Rate and Rhythm: Regular rhythm. Tachycardia present.     Pulses: Normal pulses.  Pulmonary:     Effort: Pulmonary effort is normal. No respiratory distress.     Breath sounds: Normal breath sounds. No wheezing.  Musculoskeletal:        General: Tenderness and signs of injury present.     Left forearm: Laceration present.       Arms:  Skin:    General: Skin is warm and dry.  Neurological:     General: No focal deficit present.     Mental Status: He is alert and oriented to person, place, and time. Mental status is at baseline.    ED Results / Procedures / Treatments   Labs (all labs ordered are listed, but only abnormal results are displayed) Labs Reviewed - No data to display  EKG None  Radiology No results found.  Procedures Procedures  LACERATION REPAIR Performed  by: Gwyneth Sprout Authorized by: Gwyneth Sprout Consent: Verbal consent obtained. Risks and benefits: risks, benefits and alternatives were discussed Consent given by: patient Patient identity confirmed: provided demographic data Prepped and Draped in normal sterile fashion Wound explored  Laceration Location: right dorsal forearm  Laceration Length: 2cm  No Foreign Bodies seen or palpated  Anesthesia: local infiltration  Local anesthetic: lidocaine 2% without epinephrine  Anesthetic total: 4 ml  Irrigation method: syringe Amount of cleaning: standard  Skin closure: staples  Number of sutures: 2    Patient tolerance: Patient tolerated the procedure well with no immediate complications.   Medications Ordered in ED Medications  lidocaine-EPINEPHrine (XYLOCAINE W/EPI) 1  %-1:100000 (with pres) injection 10 mL (has no administration in time range)  lidocaine (PF) (XYLOCAINE) 1 % injection (has no administration in time range)    ED Course  I have reviewed the triage vital signs and the nursing notes.  Pertinent labs & imaging results that were available during my care of the patient were reviewed by me and considered in my medical decision making (see chart for details).    MDM Rules/Calculators/A&P                           Pt with wound as above.  Treated with staples due to being gaping.  Discussed with him the possiblity of infection due to length of time since wound.  Irrigated well and scrubbed.  Pt given return precautions.  Final Clinical Impression(s) / ED Diagnoses Final diagnoses:  Laceration of right forearm, initial encounter    Rx / DC Orders ED Discharge Orders     None        Gwyneth Sprout, MD 08/19/21 1201

## 2021-08-19 NOTE — ED Triage Notes (Addendum)
Patient here for evaluation of cut right forearm he accidentally caused with a kitchen knife yesterday while cooking, leaving an approximately 2cm x 1cm wound. Second smaller cut with well approximated edges visible proximal to larger wound. Hemorrhage controlled. Patient alert, oriented, and in no apparent distress at this time.

## 2021-08-19 NOTE — ED Notes (Signed)
Pt verbalized understanding of d/c instructions, meds and followup care. Denies questions. VSS, no distress noted. Steady gait to exit with all belongings.  ?

## 2021-08-19 NOTE — Discharge Instructions (Signed)
Remove the staples in 7 days.  If the area starts to look red or drain pus the staples need to come out sooner

## 2022-02-03 ENCOUNTER — Emergency Department (HOSPITAL_COMMUNITY)
Admission: EM | Admit: 2022-02-03 | Discharge: 2022-02-03 | Disposition: A | Discharge status: Home / Self Care | Payer: Self-pay | Attending: Emergency Medicine | Admitting: Emergency Medicine

## 2022-02-03 ENCOUNTER — Encounter (HOSPITAL_COMMUNITY): Payer: Self-pay | Admitting: Emergency Medicine

## 2022-02-03 ENCOUNTER — Emergency Department (HOSPITAL_COMMUNITY): Payer: Self-pay

## 2022-02-03 ENCOUNTER — Other Ambulatory Visit: Payer: Self-pay

## 2022-02-03 DIAGNOSIS — W109XXA Fall (on) (from) unspecified stairs and steps, initial encounter: Secondary | ICD-10-CM | POA: Insufficient documentation

## 2022-02-03 DIAGNOSIS — R0781 Pleurodynia: Secondary | ICD-10-CM | POA: Insufficient documentation

## 2022-02-03 MED ORDER — IBUPROFEN 800 MG PO TABS
800.0000 mg | ORAL_TABLET | Freq: Once | ORAL | Status: AC
  Administered 2022-02-03: 800 mg via ORAL
  Filled 2022-02-03: qty 1

## 2022-02-03 NOTE — Discharge Instructions (Addendum)
You were seen in the emergency department today after falling.  Your ribs are not fractured.  Your lungs appear healthy.  Please return to the emergency department if you have worsening pain, cough with fever.  Please splint your side when you are coughing or taking deep breaths as this will help with pain.  Please take ibuprofen or Tylenol as needed for pain relief. ?

## 2022-02-03 NOTE — ED Triage Notes (Signed)
Patient reports tripping over his dogs this morning and falling down 7 steps. Hit left side of rib cage on steps.  ?

## 2022-02-03 NOTE — ED Provider Triage Note (Signed)
Emergency Medicine Provider Triage Evaluation Note ? ?Izyk Marty , a 36 y.o. male  was evaluated in triage.  Pt complains of right-sided rib pain.  Patient states this morning he was coming down the stairs when he tripped over his dog falling onto his right ribs.  He states that he is having pain as well as pain with inspiration.. ? ?Review of Systems  ?Positive: See above ?Negative:  ? ?Physical Exam  ?BP 140/83 (BP Location: Left Arm)   Pulse 86   Temp 98.7 ?F (37.1 ?C) (Oral)   Resp 14   Ht 6\' 1"  (1.854 m)   Wt 81.6 kg   SpO2 97%   BMI 23.75 kg/m?  ?Gen:   Awake, no distress   ?Resp:  Normal effort, decreased sounds on the right ?MSK:   Moves extremities without difficulty  ?Other:  Tenderness to palpation of the right rib cage ? ?Medical Decision Making  ?Medically screening exam initiated at 9:34 AM.  Appropriate orders placed.  Tracy Kinner was informed that the remainder of the evaluation will be completed by another provider, this initial triage assessment does not replace that evaluation, and the importance of remaining in the ED until their evaluation is complete. ? ? ?  ?Annice Pih, PA-C ?02/03/22 [phone] ? ?

## 2022-02-03 NOTE — ED Provider Notes (Signed)
?[facility] EMERGENCY DEPARTMENT ?Provider Note ? ? ?CSN: [id number] ?Arrival date & time: 02/03/22  0841 ? ?  ? ?History ? ?Chief Complaint  ?Patient presents with  ? Fall  ? Rib Injury  ? ? ?[name] is a 36 y.o. male.  With no pertinent past medical history presents to the emergency department after fall. ? ?Patient states that this morning he was going downstairs when he tripped over his dog falling onto his right ribs.  He states that immediately after he began having pain.  He states he does have previous rib fractures to the right side.  He states that since incident he has had pain with inspiration.  Denies any cough, fevers, other injuries.  Denies hitting his head or loss of consciousness.  Not anticoagulated. ? ? ?Fall ? ? ?  ? ?Home Medications ?Prior to Admission medications   ?Medication Sig Start Date End Date Taking? Authorizing Provider  ?ibuprofen (ADVIL) 600 MG tablet Take 600 mg by mouth every 6 (six) hours as needed.    [provider]  ?methocarbamol (ROBAXIN) 500 MG tablet Take 1 tablet (500 mg total) by mouth 2 (two) times daily. 04/20/20   Gailen Shelter, PA  ?   ? ?Allergies    ?Patient has no known allergies.   ? ?Review of Systems   ?Review of Systems  ?Musculoskeletal:  Positive for arthralgias and myalgias.  ?All other systems reviewed and are negative. ? ?Physical Exam ?Updated Vital Signs ?BP 140/83 (BP Location: Left Arm)   Pulse 86   Temp 98.7 ?F (37.1 ?C) (Oral)   Resp 14   Ht 6\' 1"  (1.854 m)   Wt 81.6 kg   SpO2 97%   BMI 23.75 kg/m?  ?Physical Exam ?Vitals and nursing note reviewed.  ?Constitutional:   ?   General: He is not in acute distress. ?   Appearance: Normal appearance. He is normal weight. He is not ill-appearing or toxic-appearing.  ?HENT:  ?   Head: Normocephalic and atraumatic.  ?   Mouth/Throat:  ?   Mouth: Mucous membranes are moist.  ?   Pharynx: Oropharynx is clear.  ?Eyes:  ?   General: No scleral icterus. ?   Extraocular  Movements: Extraocular movements intact.  ?   Pupils: Pupils are equal, round, and reactive to light.  ?Cardiovascular:  ?   Rate and Rhythm: Normal rate and regular rhythm.  ?   Pulses: Normal pulses.  ?   Heart sounds: No murmur heard. ?Pulmonary:  ?   Effort: Pulmonary effort is normal. No accessory muscle usage or respiratory distress.  ?   Breath sounds: Decreased air movement present. No wheezing, rhonchi or rales.  ?   Comments: Decreased air movement on the right ?Chest:  ?   Chest wall: Tenderness present.  ? ? ?Abdominal:  ?   General: Bowel sounds are normal.  ?   Palpations: Abdomen is soft.  ?Musculoskeletal:  ?   Cervical back: Neck supple. No tenderness.  ?Skin: ?   General: Skin is warm and dry.  ?   Capillary Refill: Capillary refill takes less than 2 seconds.  ?Neurological:  ?   General: No focal deficit present.  ?   Mental Status: He is alert.  ?Psychiatric:     ?   Mood and Affect: Mood normal.     ?   Behavior: Behavior normal.  ? ? ?ED Results / Procedures / Treatments   ?Labs ?(all labs ordered  are listed, but only abnormal results are displayed) ?Labs Reviewed - No data to display ? ?EKG ?None ? ?Radiology ?DG Ribs Unilateral W/Chest Right ? ?Result Date: 02/03/2022 ?CLINICAL DATA:  Fall down steps.  Right lower anterior ribcage pain. EXAM: RIGHT RIBS AND CHEST - 3+ VIEW COMPARISON:  Chest and right rib radiographs 04/20/2020 FINDINGS: No acute right-sided rib fracture is seen. There is no evidence of pneumothorax or pleural effusion. Both lungs are clear. Heart size and mediastinal contours are within normal limits. IMPRESSION: No acute displaced right-sided rib fracture.  No pneumothorax. Electronically Signed   By: Neita Garnet M.D.   On: 02/03/2022 09:32   ? ?Procedures ?Procedures  ? ?Medications Ordered in ED ?Medications  ?ibuprofen (ADVIL) tablet 800 mg (800 mg Oral Given 02/03/22 0943)  ? ? ?ED Course/ Medical Decision Making/ A&P ?  ?                        ?Medical Decision  Making ?Risk ?Prescription drug management. ? ?This patient presents to the ED for concern of fall, this involves an extensive number of treatment options, and is a complaint that carries with it a high risk of complications and morbidity.  The differential diagnosis includes fracture, dislocation, contusion, etc. ? ? ?Co morbidities that complicate the patient evaluation ?None ? ?Additional history obtained:  ?Additional history obtained from: None ?External records from outside source obtained and reviewed including: None ? ?EKG: ?Not required ? ?Cardiac Monitoring: ?Not required ? ?Lab Results: ?None required ? ?Imaging Studies ordered:  ?I ordered imaging studies which included x-ray.  I independently reviewed & interpreted imaging & am in agreement with radiology impression. ?Imaging shows: ?X-ray of the right ribs shows no rib fractures, pneumothorax, pleural effusion ? ?Medications  ?I ordered medication including ibuprofen for pain ?Reevaluation of the patient after medication shows that patient stayed the same ?-I reviewed the patient's home medications and did not make adjustments. ?-I did not prescribe new home medications. ? ?Tests Considered: ?None required ? ?Critical Interventions: ?None required ? ?Consultations: ?None required ? ?SDH ?None identified ? ?ED Course: ?36 year old male who presents emergency department with right rib pain after fall. ? ?He does have tenderness to palpation of the right rib cage without obvious deformities.  He does have decreased lung sounds on the right but they are not absent.  No rales, rhonchi.  He is oxygenating well without respiratory distress. ?X-ray of the right rib cage shows no acute fractures.  There is also no pneumothorax or pleural effusion.  Again he is oxygenating well without respiratory distress or need for supplemental oxygen. ?No other injuries.  He did not hit his head or lose consciousness. ? ?Given ice here in the emergency department as well as  ibuprofen.  Discussed findings with patient.  Discussed that he will need to splint with coughing or deep breathing to prevent pneumonia over the next week.  Discussed using Tylenol and ibuprofen as needed for pain.  Can continue to ice multiple times a day to reduce swelling.  Discussed return precautions for any cough with fever or worsening shortness of breath.  He verbalized understanding.  Otherwise, follow-up PCP. ? ?After consideration of the diagnostic results and the patients response to treatment, I feel that the patent would benefit from discharge. ?The patient has been appropriately medically screened and/or stabilized in the ED. I have low suspicion for any other emergent medical condition which would require further screening, evaluation or treatment  in the ED or require inpatient management. The patient is overall well appearing and non-toxic in appearance. They are hemodynamically stable at time of discharge.   ?Final Clinical Impression(s) / ED Diagnoses ?Final diagnoses:  ?Rib pain on right side  ? ? ?Rx / DC Orders ?ED Discharge Orders   ? ? None  ? ?  ? ? ?  ?Cristopher PeruAutry, Avree Szczygiel E, PA-C ?02/03/22 [phone] ? ?  ?Mancel BaleWentz, Elliott, MD ?02/03/22 1857 ? ?

## 2022-08-09 ENCOUNTER — Emergency Department (HOSPITAL_COMMUNITY)
Admission: EM | Admit: 2022-08-09 | Discharge: 2022-08-09 | Discharge status: Against Medical Advice | Payer: Self-pay | Attending: Emergency Medicine | Admitting: Emergency Medicine

## 2022-08-09 ENCOUNTER — Other Ambulatory Visit: Payer: Self-pay

## 2022-08-09 ENCOUNTER — Encounter (HOSPITAL_COMMUNITY): Payer: Self-pay

## 2022-08-09 DIAGNOSIS — H538 Other visual disturbances: Secondary | ICD-10-CM | POA: Insufficient documentation

## 2022-08-09 DIAGNOSIS — Z5321 Procedure and treatment not carried out due to patient leaving prior to being seen by health care provider: Secondary | ICD-10-CM | POA: Insufficient documentation

## 2022-08-09 NOTE — ED Notes (Signed)
Pt left due to tired of waiting to be seen.
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
## 2024-09-23 ENCOUNTER — Encounter (HOSPITAL_BASED_OUTPATIENT_CLINIC_OR_DEPARTMENT_OTHER): Payer: Self-pay

## 2024-09-23 ENCOUNTER — Emergency Department (HOSPITAL_BASED_OUTPATIENT_CLINIC_OR_DEPARTMENT_OTHER)
Admission: EM | Admit: 2024-09-23 | Discharge: 2024-09-23 | Disposition: A | Discharge status: Home / Self Care | Payer: Self-pay | Attending: Emergency Medicine | Admitting: Emergency Medicine

## 2024-09-23 ENCOUNTER — Other Ambulatory Visit: Payer: Self-pay

## 2024-09-23 DIAGNOSIS — H6001 Abscess of right external ear: Secondary | ICD-10-CM

## 2024-09-23 MED ORDER — CIPROFLOXACIN-DEXAMETHASONE 0.3-0.1 % OT SUSP
4.0000 [drp] | Freq: Two times a day (BID) | OTIC | Status: DC
  Administered 2024-09-23: 4 [drp] via OTIC
  Filled 2024-09-23: qty 7.5

## 2024-09-23 NOTE — ED Triage Notes (Signed)
 He c/o right-sided ear drainage x 4-5 days. It is now beginning to hurt. He denies fever, nor any other sign of current illness.

## 2024-09-23 NOTE — ED Provider Notes (Signed)
  [place] EMERGENCY DEPARTMENT AT [facility] Provider Note   CSN: [id number] Arrival date & time: 09/23/24  1058     Patient presents with: Ear Drainage   Derek Biglow is a 38 y.o. male.   Patient to ED with complaint of bleeding from the right ear that started about 3-4 days ago, becoming sore and painful today. No fever. No injury. No hearing changes, sore throat, congestion.   The history is provided by the patient. No language interpreter was used.  Ear Drainage       Prior to Admission medications   Medication Sig Start Date End Date Taking? Authorizing Provider  ibuprofen  (ADVIL ) 600 MG tablet Take 600 mg by mouth every 6 (six) hours as needed.    [provider]  methocarbamol  (ROBAXIN ) 500 MG tablet Take 1 tablet (500 mg total) by mouth 2 (two) times daily. 04/20/20   Neldon Hamp RAMAN, PA    Allergies: Patient has no known allergies.    Review of Systems  Updated Vital Signs BP (!) 121/101   Pulse 63   Temp 98.7 F (37.1 C)   Resp 12   SpO2 98%   Physical Exam Vitals and nursing note reviewed.  Constitutional:      Appearance: Normal appearance.  HENT:     Head:     Comments: Small nodular appearing swelling in the floor of the right external canal with oozing of blood. No pus. No surrounding erythema. No pain with external ear movement. No pre- or post-auricular nodes.     Right Ear: Tympanic membrane normal.     Left Ear: Tympanic membrane and ear canal normal.  Pulmonary:     Effort: Pulmonary effort is normal.  Skin:    General: Skin is warm and dry.  Neurological:     Mental Status: He is alert.     (all labs ordered are listed, but only abnormal results are displayed) Labs Reviewed - No data to display  EKG: None  Radiology: No results found.   Procedures   Medications Ordered in the ED  ciprofloxacin -dexamethasone  (CIPRODEX ) 0.3-0.1 % OTIC (EAR) suspension 4 drop (has no administration in time range)     Clinical Course as of 09/23/24 1325  Sun Sep 23, 2024  1322 What appears to be a small growth, likely abscess in the external canal given the bleeding, drainage. No other signs of infection. Discussed treating with topical abx, but importance of f/u with ENT if no better in 2-3 days. Return precautions.  [SU]    Clinical Course User Index [SU] Odell Balls, PA-C                                 Medical Decision Making       Final diagnoses:  Abscess of ear canal, right    ED Discharge Orders     None          Odell Balls RIGGERS 09/23/24 1325    Bernard Drivers, MD 09/24/24 1219

## 2024-09-23 NOTE — Discharge Instructions (Signed)
 As we discussed, the growth in the right ear appears to be a localized infection. Use the topical antibiotic drops twice daily. If symptoms do not improve in 2-3 days, please call Dr. Carlie' office to make an appointment for further evaluation and management.   Return to the ED with any severe pain, if fever develops, or for new concern.
